# Patient Record
Sex: Male | Born: 1980 | Marital: Single | Smoking: Current every day smoker
Health system: Southern US, Community
[De-identification: ages and names within clinical notes are randomized; demographics above are authoritative.]

---

## 2015-10-07 ENCOUNTER — Other Ambulatory Visit: Payer: Self-pay | Admitting: Gastroenterology

## 2015-10-07 DIAGNOSIS — R1033 Periumbilical pain: Secondary | ICD-10-CM

## 2015-10-15 ENCOUNTER — Other Ambulatory Visit: Payer: Self-pay | Admitting: Gastroenterology

## 2015-10-15 DIAGNOSIS — R1033 Periumbilical pain: Secondary | ICD-10-CM

## 2015-10-17 ENCOUNTER — Ambulatory Visit
Admission: RE | Admit: 2015-10-17 | Discharge: 2015-10-17 | Disposition: A | Discharge status: Home / Self Care | Payer: PRIVATE HEALTH INSURANCE | Source: Ambulatory Visit | Attending: Gastroenterology | Admitting: Gastroenterology

## 2015-10-17 DIAGNOSIS — R1033 Periumbilical pain: Secondary | ICD-10-CM

## 2015-10-17 MED ORDER — IOPAMIDOL (ISOVUE-300) INJECTION 61%
100.0000 mL | Freq: Once | INTRAVENOUS | Status: AC | PRN
  Administered 2015-10-17: 100 mL via INTRAVENOUS

## 2016-07-04 IMAGING — CT CT ABD-PELV W/ CM
1 of 2 series · 16 of 32 positions shown, 20 images · IV contrast (iopamidol)
Comparison: None.

CLINICAL DATA: Periumbilical pain and bilateral flank pain for 1
month, initial encounter

EXAM:
CT ABDOMEN AND PELVIS WITH CONTRAST
TECHNIQUE: Multidetector CT imaging of the abdomen and pelvis was performed
using the standard protocol following bolus administration of
intravenous contrast.
CONTRAST:  100mL VHLS4C-[id number] IOPAMIDOL (VHLS4C-SUU) INJECTION 61%

[Series 2: abd/pelvis w/cm · axial · 0.64mm/px · z∈[-482,-27]mm · 16 of 99 slices shown, 20 images]
[im 4/99  soft-tissue]
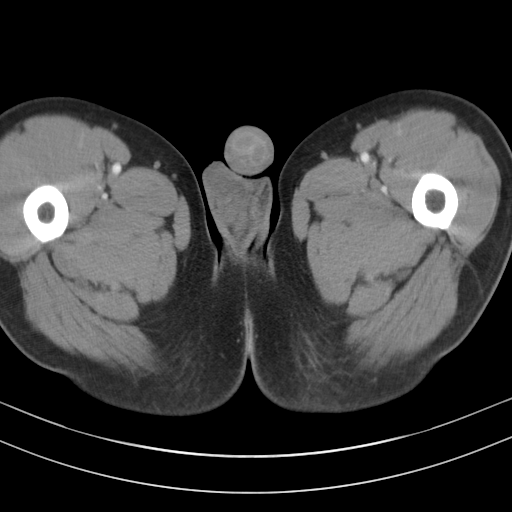
[im 4/99  bone]
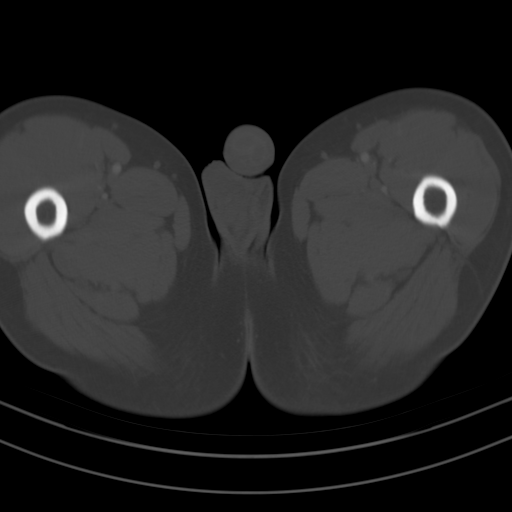
[im 12/99  soft-tissue]
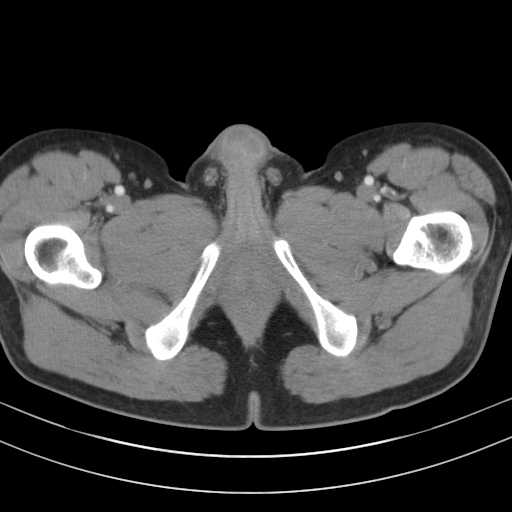
[im 20/99  soft-tissue]
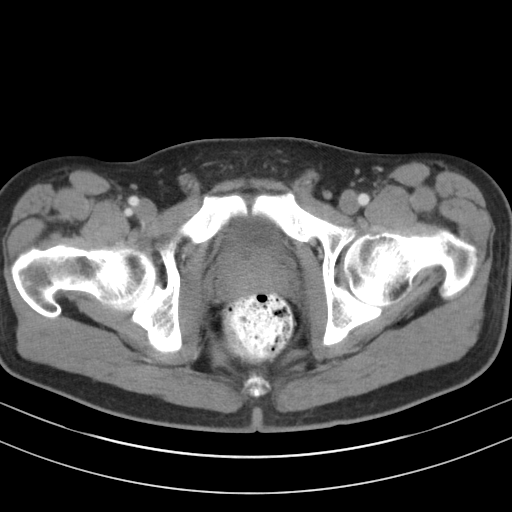
[im 28/99  soft-tissue]
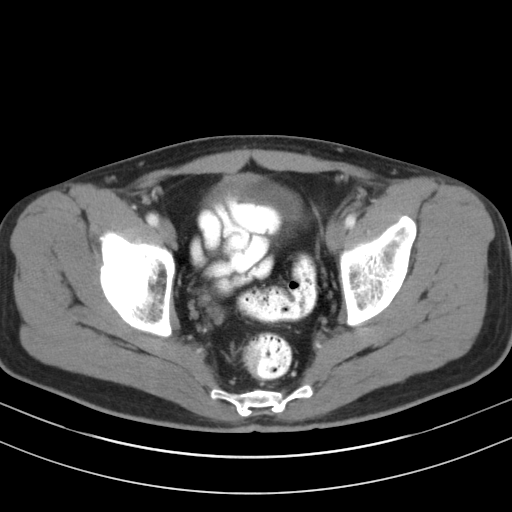
[im 32/99  soft-tissue]
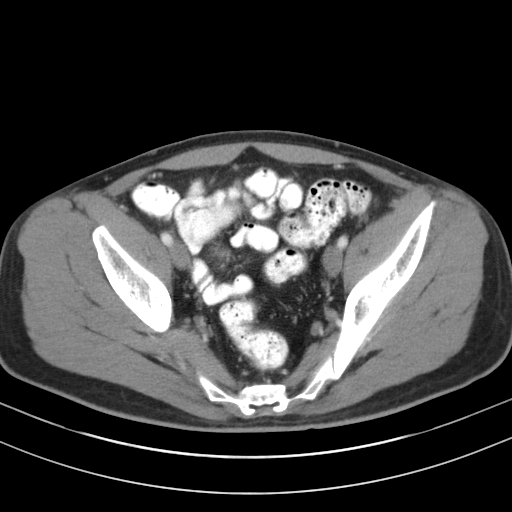
[im 40/99  soft-tissue]
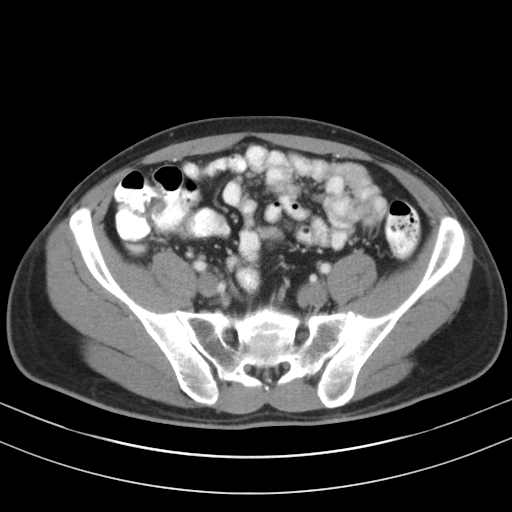
[im 48/99  soft-tissue]
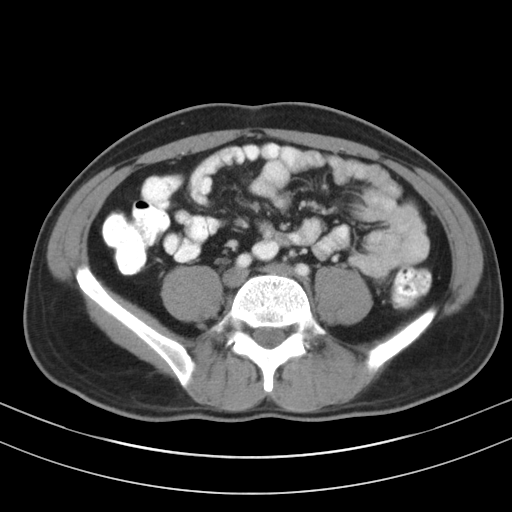
[im 51/99  soft-tissue]
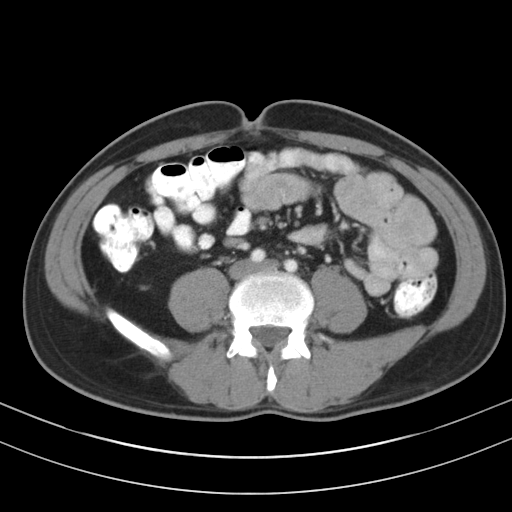
[im 59/99  soft-tissue]
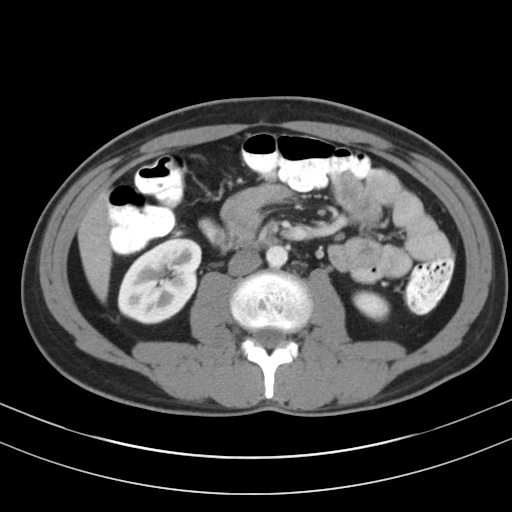
[im 59/99  bone]
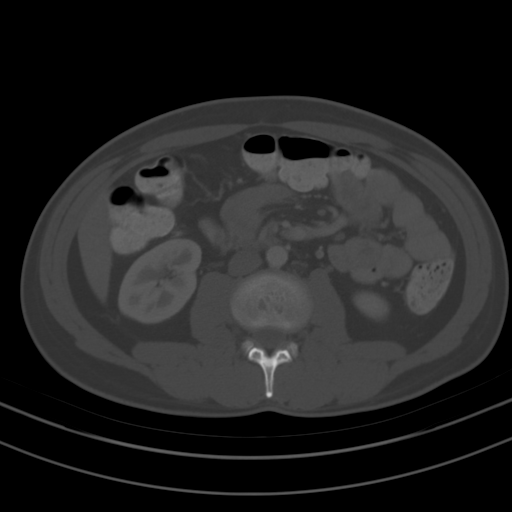
[im 67/99  soft-tissue]
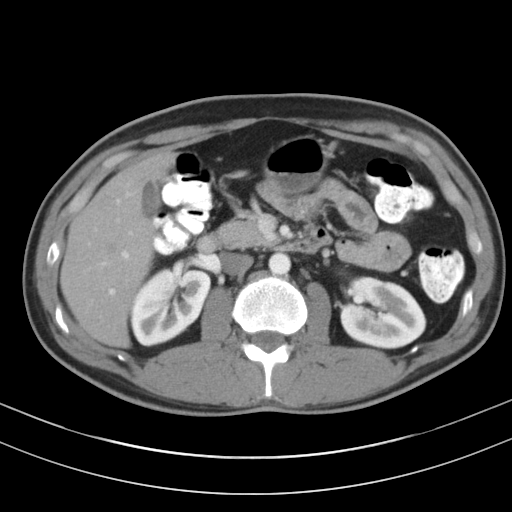
[im 75/99  soft-tissue]
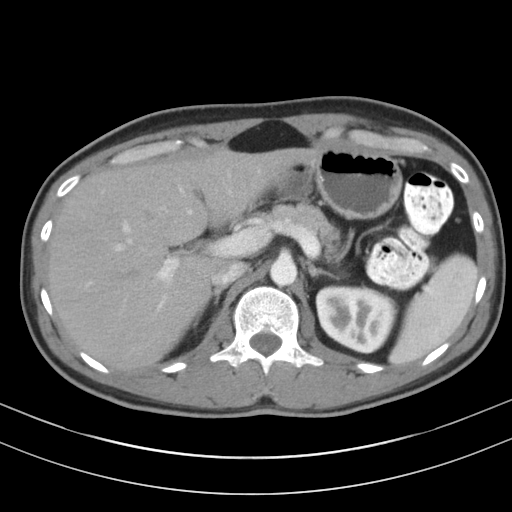
[im 79/99  soft-tissue]
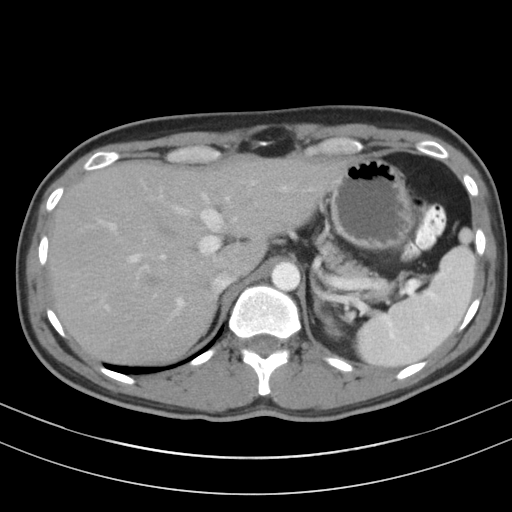
[im 83/99  lung]
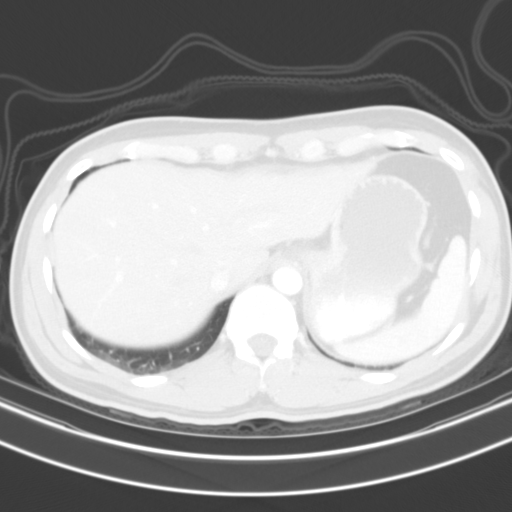
[im 87/99  soft-tissue]
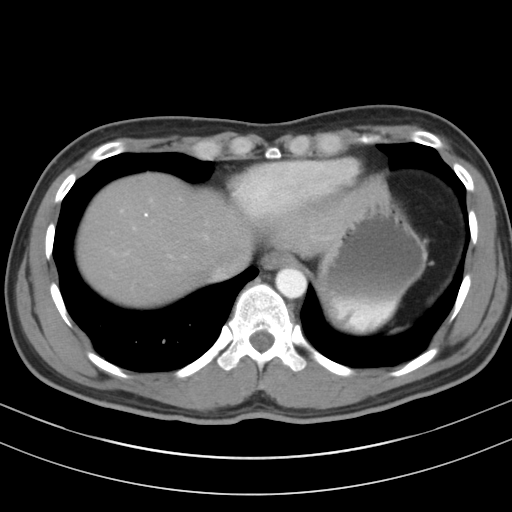
[im 87/99  lung]
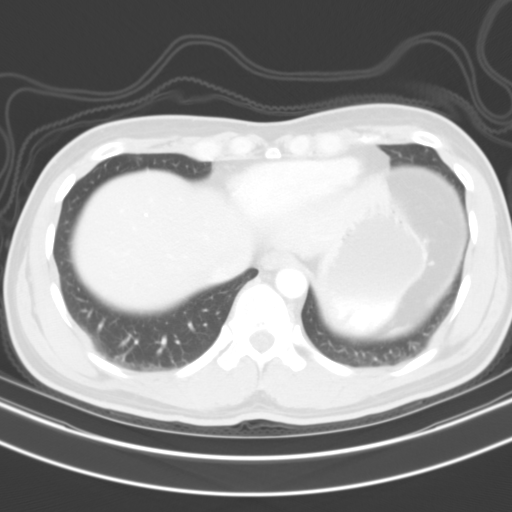
[im 91/99  lung]
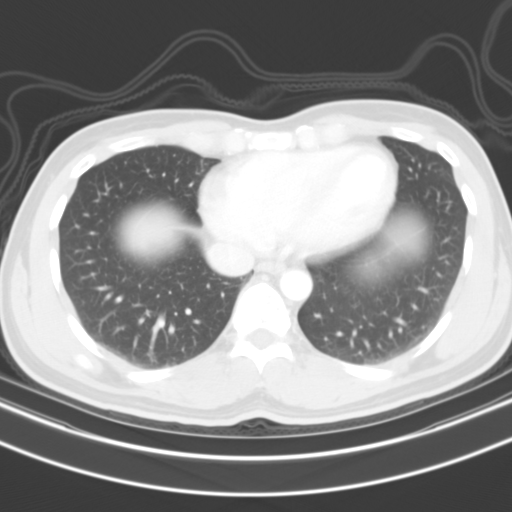
[im 95/99  soft-tissue]
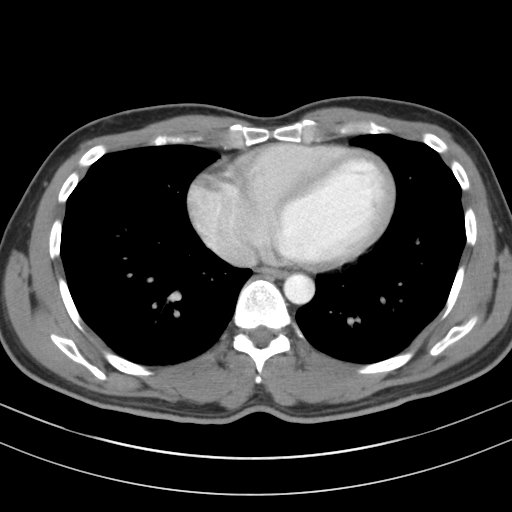
[im 95/99  lung]
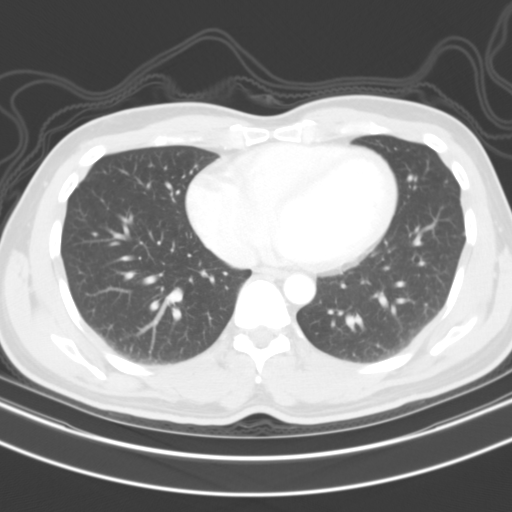

[16 of 32 positions shown; findings below may reference images not displayed]

FINDINGS: Lung bases are free of acute infiltrate or sizable effusion.

The liver, gallbladder, spleen, adrenal glands and pancreas are all
normal in their CT appearance. Kidneys are well visualized
bilaterally. No calculi or obstructive changes are noted.

The appendix is well visualized and within normal limits. The
bladder is partially distended. No pelvic mass lesion or sidewall
adenopathy is noted. No acute bony abnormality is noted. Visualized
large and small bowel are within normal limits.
IMPRESSION: No acute abnormality noted.

## 2017-08-08 ENCOUNTER — Encounter (HOSPITAL_COMMUNITY): Payer: Self-pay | Admitting: Emergency Medicine

## 2017-08-08 DIAGNOSIS — Z5321 Procedure and treatment not carried out due to patient leaving prior to being seen by health care provider: Secondary | ICD-10-CM | POA: Insufficient documentation

## 2017-08-08 DIAGNOSIS — R040 Epistaxis: Secondary | ICD-10-CM | POA: Diagnosis not present

## 2017-08-08 NOTE — ED Triage Notes (Signed)
Pt fell tonight at 2200 after drinking, c/o nose bleed. Bleeding controlled at this time. Denies blood thinners.

## 2017-08-09 ENCOUNTER — Emergency Department (HOSPITAL_COMMUNITY)
Admission: EM | Admit: 2017-08-09 | Discharge: 2017-08-09 | Disposition: A | Discharge status: Home / Self Care | Payer: PRIVATE HEALTH INSURANCE | Attending: Emergency Medicine | Admitting: Emergency Medicine

## 2017-08-09 NOTE — ED Notes (Signed)
No answer in [facility] x1

## 2017-08-09 NOTE — ED Notes (Signed)
No answer in [facility] x 2
